# Patient Record
Sex: Male | Born: 2000 | Race: White | Hispanic: No | Marital: Single | Smoking: Never smoker
Health system: Southern US, Community
[De-identification: ages and names within clinical notes are randomized; demographics above are authoritative.]

## PROBLEM LIST (undated history)

## (undated) DIAGNOSIS — K219 Gastro-esophageal reflux disease without esophagitis: Secondary | ICD-10-CM

## (undated) DIAGNOSIS — T7840XA Allergy, unspecified, initial encounter: Secondary | ICD-10-CM

## (undated) HISTORY — DX: Allergy, unspecified, initial encounter: T78.40XA

## (undated) HISTORY — DX: Gastro-esophageal reflux disease without esophagitis: K21.9

---
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
## 2018-03-03 ENCOUNTER — Ambulatory Visit: Payer: BLUE CROSS/BLUE SHIELD | Admitting: Family Medicine

## 2018-03-04 ENCOUNTER — Other Ambulatory Visit: Payer: Self-pay

## 2018-03-04 ENCOUNTER — Ambulatory Visit: Payer: BLUE CROSS/BLUE SHIELD | Admitting: Family Medicine

## 2018-03-04 ENCOUNTER — Encounter: Payer: Self-pay | Admitting: Family Medicine

## 2018-03-04 VITALS — BP 112/72 | HR 102 | Temp 97.7°F | Ht 70.0 in | Wt 132.7 lb

## 2018-03-04 DIAGNOSIS — B078 Other viral warts: Secondary | ICD-10-CM

## 2018-03-04 NOTE — Patient Instructions (Signed)
Warts    Warts are small growths on the skin. They are common and can occur on many areas of the body. A person may have one wart or several warts.  In many cases, warts do not require treatment. They usually go away on their own over a period of many months to a few years. If needed, warts that cause problems or do not go away on their own can be treated.  What are the causes?  Warts are caused by a type of virus that is called human papillomavirus (HPV).  · This virus can spread from person to person through direct contact.  · Warts can also spread to other areas of the body when a person scratches a wart and then scratches another area of his or her body.  What increases the risk?  You are more likely to develop this condition if:  · You are 10-20 years old.  · You have a weakened body defense system (immune system).  · You are Caucasian.  What are the signs or symptoms?  The main symptom of this condition is small growths on the skin. Warts may:  · Be round or oval or have an irregular shape.  · Have a rough surface.  · Range in color from skin color to light yellow, brown, or gray.  · Generally be less than ½ inch (1.3 cm) in size.  · Go away and then come back again.  Most warts are painless, but some can be painful if they are large or occur in an area of the body where pressure will be applied to them, such as the bottom of the foot.  How is this diagnosed?  A wart can usually be diagnosed based on its appearance. In some cases, a tissue sample may be removed (biopsy) to be looked at under a microscope.  How is this treated?  In many cases, warts do not need treatment. Sometimes treatment is desired. If treatment is needed or desired, options may include:  · Applying medicated solutions, creams, or patches to the wart. These may be over-the-counter or prescription medicines that make the skin soft so that layers will gradually shed away. In many cases, the medicine is applied one or two times per day and  covered with a bandage.  · Putting duct tape over the top of the wart (occlusion). You will leave the tape in place for as long as told by your health care provider and then replace it with a new strip of tape. This is done until the wart goes away.  · Freezing the wart with liquid nitrogen (cryotherapy).  · Burning the wart with:  ? Laser treatment.  ? An electrified probe (electrocautery).  · Injection of a medicine (Candida antigen) into the wart to help the body's immune system fight off the wart.  · Surgery to remove the wart.  Follow these instructions at home:  Medicines  · Apply over-the-counter and prescription medicines only as told by your health care provider.  · Do not apply over-the-counter wart medicines to your face or genitals unless your health care provider tells you to do that.  Lifestyle  · Keep your immune system healthy. To do this:  ? Eat a healthy, balanced diet.  ? Get enough sleep.  ? Do not use any products that contain nicotine or tobacco, such as cigarettes and e-cigarettes. If you need help quitting, ask your health care provider.  General instructions    · Wash your hands   after you touch a wart.  · Do not scratch or pick at a wart.  · Avoid shaving hair that is over a wart.  · Keep all follow-up visits as told by your health care provider. This is important.  Contact a health care provider if:  · Your warts do not improve after treatment.  · You have redness, swelling, or pain at the site of a wart.  · You have bleeding from a wart that does not stop with light pressure.  · You have diabetes and you develop a wart.  Summary  · Warts are small growths on the skin. They are common and can occur on many areas of the body.  · In many cases, warts do not need treatment. Sometimes treatment is desired. If treatment is needed or desired, there are several treatment options.  · Apply over-the-counter and prescription medicines only as told by your health care provider.  · Wash your hands  after you touch a wart.  · Keep all follow-up visits as told by your health care provider. This is important.  This information is not intended to replace advice given to you by your health care provider. Make sure you discuss any questions you have with your health care provider.  Document Released: 11/21/2004 Document Revised: 07/01/2017 Document Reviewed: 07/01/2017  Elsevier Interactive Patient Education © 2019 Elsevier Inc.

## 2018-03-04 NOTE — Progress Notes (Signed)
  Subjective:     Patient ID: Arthur Perez, male   DOB: 26-Aug-2000, 18 y.o.   MRN: [id number]  HPI Patient is seen with wart left thumb.  This has been treated with liquid nitrogen in the past.  He did see reduction in size but never fully went away.  He would like to consider treatment again.  This is partly irritating because of location He has not tried any over-the-counter topicals  Review of Systems  HENT: Negative for congestion and sore throat.   Hematological: Negative for adenopathy.       Objective:   Physical Exam HENT:     Mouth/Throat:     Pharynx: Oropharynx is clear.  Neck:     Musculoskeletal: Neck supple.  Lymphadenopathy:     Cervical: No cervical adenopathy.  Skin:    Comments: Has fairly large approximately half centimeter wart on the medial aspect left thumb        Assessment:     Common wart left thumb    Plan:     -Discussed risk and benefits of treat with liquid nitrogen.  Patient consented.  We treated this with cryotherapy and patient tolerated well -We recommended follow-up in 3 weeks to reassess.  Consider retreatment then if this is not fully resolved  Eulas Post MD [facility] at [facility]

## 2018-10-23 ENCOUNTER — Encounter: Payer: Self-pay | Admitting: Family Medicine

## 2018-10-23 ENCOUNTER — Other Ambulatory Visit: Payer: Self-pay

## 2018-10-23 ENCOUNTER — Ambulatory Visit (INDEPENDENT_AMBULATORY_CARE_PROVIDER_SITE_OTHER): Payer: BC Managed Care – PPO | Admitting: Family Medicine

## 2018-10-23 VITALS — BP 108/70 | HR 95 | Temp 97.9°F | Ht 70.0 in | Wt 129.7 lb

## 2018-10-23 DIAGNOSIS — K219 Gastro-esophageal reflux disease without esophagitis: Secondary | ICD-10-CM

## 2018-10-23 DIAGNOSIS — Z23 Encounter for immunization: Secondary | ICD-10-CM | POA: Diagnosis not present

## 2018-10-23 DIAGNOSIS — B078 Other viral warts: Secondary | ICD-10-CM | POA: Diagnosis not present

## 2018-10-23 NOTE — Progress Notes (Signed)
  Subjective:     Patient ID: Arthur Perez, male   DOB: Nov 22, 2000, 18 y.o.   MRN: [id number]  HPI Patient is seen for the following issues  He has had ongoing issues with GERD intermittently throughout his childhood.  Has had some recent issues with GERD especially at night.  Worse after fatty foods such as pizza.  Spicy foods seem to aggravate as well.  He has cut back on caffeine.  No regular alcohol.  He uses Tums which usually helps.  No dysphagia.  Appetite and weight stable.  Patient has recurrent wart left thumb.  This been treated with liquid nitrogen past and responded temporarily.  He has not tried any over-the-counter topicals such as salicylic acid.  This has responded in the past with liquid nitrogen but tends to recur each time.  He is requesting flu vaccine.  He attends [employer] and is doing combination of classes both virtually and in person.  Past Medical History:  Diagnosis Date  . Allergy   . GERD (gastroesophageal reflux disease)    ?  . Jaundice, physiologic, newborn    History reviewed. No pertinent surgical history.  reports that he has never smoked. He has never used smokeless tobacco. He reports that he does not drink alcohol or use drugs. family history includes Other in his sister. No Known Allergies   Review of Systems  Constitutional: Negative for appetite change, chills, fever and unexpected weight change.  HENT: Negative for trouble swallowing.   Respiratory: Negative for cough and shortness of breath.   Cardiovascular: Negative for chest pain.  Gastrointestinal: Negative for abdominal pain.  Hematological: Negative for adenopathy.       Objective:   Physical Exam Constitutional:      Appearance: Normal appearance.  Cardiovascular:     Rate and Rhythm: Normal rate and regular rhythm.  Pulmonary:     Effort: Pulmonary effort is normal.     Breath sounds: Normal breath sounds.  Skin:    Comments: Patient has common wart left thumb at  the interphalangeal joint.  Approximately 8 to 9 mm diameter  Neurological:     Mental Status: He is alert.        Assessment:     #1 frequent GERD symptoms.  #2 Common wart involving left thumb    Plan:     -Discussed dietary factors related to GERD.  Avoid late night eating.  Reduce fatty and spicy foods.  Consider over-the-counter Pepcid 20 mg twice daily as needed  -We discussed various treatment options for his wart.  He has had good response with liquid nitrogen but unfortunately recurrence.  We discussed topical treatments such as TCA.  After discussing risk and benefits of liquid nitrogen and topical therapy including pain, blistering, infection he wished to proceed with trial of combination therapy with liquid nitrogen followed by 80% TCA.  We will plan follow-up in 1 week to reassess and consider repeat application then  Eulas Post MD [facility] at [facility]

## 2018-10-23 NOTE — Patient Instructions (Signed)
Food Choices for Gastroesophageal Reflux Disease, Adult When you have gastroesophageal reflux disease (GERD), the foods you eat and your eating habits are very important. Choosing the right foods can help ease the discomfort of GERD. Consider working with a diet and nutrition specialist (dietitian) to help you make healthy food choices. What general guidelines should I follow?  Eating plan  Choose healthy foods low in fat, such as fruits, vegetables, whole grains, low-fat dairy products, and lean meat, fish, and poultry.  Eat frequent, small meals instead of three large meals each day. Eat your meals slowly, in a relaxed setting. Avoid bending over or lying down until 2-3 hours after eating.  Limit high-fat foods such as fatty meats or fried foods.  Limit your intake of oils, butter, and shortening to less than 8 teaspoons each day.  Avoid the following: ? Foods that cause symptoms. These may be different for different people. Keep a food diary to keep track of foods that cause symptoms. ? Alcohol. ? Drinking large amounts of liquid with meals. ? Eating meals during the 2-3 hours before bed.  Cook foods using methods other than frying. This may include baking, grilling, or broiling. Lifestyle  Maintain a healthy weight. Ask your health care provider what weight is healthy for you. If you need to lose weight, work with your health care provider to do so safely.  Exercise for at least 30 minutes on 5 or more days each week, or as told by your health care provider.  Avoid wearing clothes that fit tightly around your waist and chest.  Do not use any products that contain nicotine or tobacco, such as cigarettes and e-cigarettes. If you need help quitting, ask your health care provider.  Sleep with the head of your bed raised. Use a wedge under the mattress or blocks under the bed frame to raise the head of the bed. What foods are not recommended? The items listed may not be a complete  list. Talk with your dietitian about what dietary choices are best for you. Grains Pastries or quick breads with added fat. French toast. Vegetables Deep fried vegetables. French fries. Any vegetables prepared with added fat. Any vegetables that cause symptoms. For some people this may include tomatoes and tomato products, chili peppers, onions and garlic, and horseradish. Fruits Any fruits prepared with added fat. Any fruits that cause symptoms. For some people this may include citrus fruits, such as oranges, grapefruit, pineapple, and lemons. Meats and other protein foods High-fat meats, such as fatty beef or pork, hot dogs, ribs, ham, sausage, salami and bacon. Fried meat or protein, including fried fish and fried chicken. Nuts and nut butters. Dairy Whole milk and chocolate milk. Sour cream. Cream. Ice cream. Cream cheese. Milk shakes. Beverages Coffee and tea, with or without caffeine. Carbonated beverages. Sodas. Energy drinks. Fruit juice made with acidic fruits (such as orange or grapefruit). Tomato juice. Alcoholic drinks. Fats and oils Butter. Margarine. Shortening. Ghee. Sweets and desserts Chocolate and cocoa. Donuts. Seasoning and other foods Pepper. Peppermint and spearmint. Any condiments, herbs, or seasonings that cause symptoms. For some people, this may include curry, hot sauce, or vinegar-based salad dressings. Summary  When you have gastroesophageal reflux disease (GERD), food and lifestyle choices are very important to help ease the discomfort of GERD.  Eat frequent, small meals instead of three large meals each day. Eat your meals slowly, in a relaxed setting. Avoid bending over or lying down until 2-3 hours after eating.  Limit high-fat   foods such as fatty meat or fried foods. This information is not intended to replace advice given to you by your health care provider. Make sure you discuss any questions you have with your health care provider. Document Released:  02/11/2005 Document Revised: 06/04/2018 Document Reviewed: 02/13/2016 Elsevier Patient Education  2020 Elsevier Inc.  

## 2018-10-23 NOTE — Addendum Note (Signed)
Addended by: Sheffield Slider L on: 10/23/2018 11:10 AM   Modules accepted: Orders

## 2018-10-25 DIAGNOSIS — Z1159 Encounter for screening for other viral diseases: Secondary | ICD-10-CM | POA: Diagnosis not present

## 2018-10-25 DIAGNOSIS — R0981 Nasal congestion: Secondary | ICD-10-CM | POA: Diagnosis not present

## 2018-10-26 ENCOUNTER — Telehealth: Payer: Self-pay | Admitting: Family Medicine

## 2018-10-26 NOTE — Telephone Encounter (Signed)
Copied from [place] [phone]. Topic: General - Inquiry >> Oct 26, 2018  4:26 PM Alanda Slim E wrote: Reason for CRM: Pt has lost his sense of taste and went to get tested and wants to advised Dr. Elease Hashimoto. Pt would like to know what Dr. Elease Hashimoto recommends if he does test positive for covid/ please advise

## 2018-10-28 ENCOUNTER — Telehealth: Payer: Self-pay | Admitting: *Deleted

## 2018-10-28 NOTE — Telephone Encounter (Signed)
If negative, probably secondary to congestion/allergies.  We are going to be seeing him back soon for wart follow up and will discuss then.

## 2018-10-28 NOTE — Telephone Encounter (Signed)
Copied from [place] [phone]. Topic: General - Inquiry >> Oct 26, 2018  4:26 PM Alanda Slim E wrote: Reason for CRM: Pt has lost his sense of taste and went to get tested and wants to advised Dr. Elease Hashimoto. Pt would like to know what Dr. Elease Hashimoto recommends if he does test positive for covid/ please advise >> Oct 28, 2018  6:00 PM Mcneil, Ja-Kwan wrote: Pt mother called in to report that the urgent care contacted pt to advise that pt tested positive for Covid-19. Pt mother stated pt was just in the office for an appt and requests a call back. Pt mother stated she needs to know what Dr. Elease Hashimoto recommendations are.

## 2018-10-28 NOTE — Telephone Encounter (Signed)
Mom is calling and stated pt's covid test came back negative. Please call to advise

## 2018-10-28 NOTE — Telephone Encounter (Signed)
Please see message.  Please advise. 

## 2018-10-28 NOTE — Telephone Encounter (Signed)
I called patients mom and she stated that the urgent care called back and said his Covid test is positive. Now mother is having symptoms and having some chest pressure and a lower heart rate. They both have left ear pain. FYI, setting up Telephone visit for her.

## 2018-10-29 NOTE — Telephone Encounter (Signed)
I spoke with pt and mom last night.  He indicated he had loss of smell and taste and some mild nasal congestion but o/w no symptoms.  No fever, cough, or dyspnea.  We were around him on 10-23-18 but pt and I had on mask full time and most of the 9-10 minute time in room were > 10 feet apart.  Only very briefly (when freezing his wart) est 2 minutes was in closer proximity.  Mom has no symptoms but she plans to contact her physician.

## 2018-11-05 NOTE — Telephone Encounter (Signed)
Recommend stay self quarantined for 14 days - can retest at that time

## 2018-11-05 NOTE — Telephone Encounter (Signed)
Please advise 

## 2018-11-05 NOTE — Telephone Encounter (Signed)
Patient and Mom is aware.

## 2018-11-30 ENCOUNTER — Telehealth (INDEPENDENT_AMBULATORY_CARE_PROVIDER_SITE_OTHER): Payer: BC Managed Care – PPO | Admitting: Family Medicine

## 2018-11-30 ENCOUNTER — Other Ambulatory Visit: Payer: Self-pay

## 2018-11-30 ENCOUNTER — Telehealth: Payer: Self-pay

## 2018-11-30 DIAGNOSIS — U071 COVID-19: Secondary | ICD-10-CM

## 2018-11-30 NOTE — Progress Notes (Signed)
This visit type was conducted due to national recommendations for restrictions regarding the COVID-19 pandemic in an effort to limit this patient's exposure and mitigate transmission in our community.   Virtual Visit via Video Note  I connected with Arthur Perez on 11/30/18 at  3:45 PM EDT by a video enabled telemedicine application and verified that I am speaking with the correct person using two identifiers.  Location patient: home Location provider:work or home office Persons participating in the virtual visit: patient, provider  I discussed the limitations of evaluation and management by telemedicine and the availability of in person appointments. The patient expressed understanding and agreed to proceed.   HPI: Appointment was set up apparently by his mother.  He did have confirmed COVID infection back in the end of August.  He was minimally symptomatic.  He had loss of taste and smell and some nasal congestion but really no other symptoms.  He was not aware of any cough or shortness of breath or significant fever.  His symptoms resolved after a few days.  His mother apparently called with concerns he was having increased fatigue and seemed quieter than usual.  Silus denies this.  He states he is working about 15 to 20 hours/week and also in school about 20 to 30 hours/week.  He feels he is getting adequate sleep.  He states he is not more fatigued than usual.  He denies any depression symptoms.  He feels fully recovered at this time.   ROS: See pertinent positives and negatives per HPI.  Past Medical History:  Diagnosis Date  . Allergy   . GERD (gastroesophageal reflux disease)    ?  . Jaundice, physiologic, newborn     No past surgical history on file.  Family History  Problem Relation Age of Onset  . Other Sister        CAPD    SOCIAL HX: Non-smoker   Current Outpatient Medications:  .  calcium carbonate (TUMS - DOSED IN MG ELEMENTAL CALCIUM) 500 MG chewable tablet,  Chew 1 tablet by mouth daily., Disp: , Rfl:   EXAM:  VITALS per patient if applicable:  GENERAL: alert, oriented, appears well and in no acute distress  HEENT: atraumatic, conjunttiva clear, no obvious abnormalities on inspection of external nose and ears  NECK: normal movements of the head and neck  LUNGS: on inspection no signs of respiratory distress, breathing rate appears normal, no obvious gross SOB, gasping or wheezing  CV: no obvious cyanosis  MS: moves all visible extremities without noticeable abnormality  PSYCH/NEURO: pleasant and cooperative, no obvious depression or anxiety, speech and thought processing grossly intact  ASSESSMENT AND PLAN:  Discussed the following assessment and plan:  COVID-19 virus infection-recovered symptomatically at this point.  Mother had some concerns about fatigue but patient adamantly denies this. -Recommend observation for now.  No further testing needed at this time     I discussed the assessment and treatment plan with the patient. The patient was provided an opportunity to ask questions and all were answered. The patient agreed with the plan and demonstrated an understanding of the instructions.   The patient was advised to call back or seek an in-person evaluation if the symptoms worsen or if the condition fails to improve as anticipated.    Carolann Littler, MD

## 2018-11-30 NOTE — Telephone Encounter (Signed)
Copied from [place] [id number]. Topic: General - Other >> Nov 30, 2018 12:22 PM Carolyn Stare wrote: Mom call to say pt still has some what of a cough and she was concerned about it and wanted to let Dr Elease Hashimoto know

## 2018-12-02 NOTE — Telephone Encounter (Signed)
I already spoke to [place] (yesterday) and he stated that he felt fine- no fever, dyspnea, fatigue, or significant cough.

## 2018-12-04 NOTE — Telephone Encounter (Signed)
Please advise 

## 2018-12-04 NOTE — Telephone Encounter (Signed)
If patient has concerns, can set up Doxy.  As noted, I spoke with pt earlier in week and HE had no concerns.

## 2018-12-04 NOTE — Telephone Encounter (Signed)
Pts mom was called and told pt was not concerned with cough but if he was then he could call and set up appt to be seen or do a VV. Mom stated patient needs appt for wart removal. This was scheduled.

## 2018-12-09 ENCOUNTER — Other Ambulatory Visit: Payer: Self-pay

## 2018-12-09 ENCOUNTER — Encounter: Payer: Self-pay | Admitting: Family Medicine

## 2018-12-09 ENCOUNTER — Ambulatory Visit (INDEPENDENT_AMBULATORY_CARE_PROVIDER_SITE_OTHER): Payer: BC Managed Care – PPO | Admitting: Family Medicine

## 2018-12-09 VITALS — BP 92/58 | HR 76 | Temp 98.5°F | Resp 16 | Ht 70.0 in | Wt 132.6 lb

## 2018-12-09 DIAGNOSIS — B078 Other viral warts: Secondary | ICD-10-CM | POA: Diagnosis not present

## 2018-12-09 NOTE — Progress Notes (Signed)
  Subjective:     Patient ID: Arthur Perez, male   DOB: 2001/01/22, 18 y.o.   MRN: [id number]  HPI Here for retreatment of wart left thumb.  This has been present intermittently for several months.  We treated with liquid nitrogen several weeks ago and this has diminished some in size.  He has not tried any recent salicylic acid.  Patient did have recent Covid and is fully recovered at this time.  His symptoms were very mild.  Past Medical History:  Diagnosis Date  . Allergy   . GERD (gastroesophageal reflux disease)    ?  . Jaundice, physiologic, newborn    No past surgical history on file.  reports that he has never smoked. He has never used smokeless tobacco. He reports that he does not drink alcohol or use drugs. family history includes Other in his sister. No Known Allergies   Review of Systems  Constitutional: Negative for chills and fever.  Respiratory: Negative for cough.        Objective:   Physical Exam Constitutional:      Appearance: Normal appearance.  Skin:    Comments: Patient has common wart left thumb between the interphalangeal and MCP joint.  Neurological:     Mental Status: He is alert.        Assessment:     Common wart left thumb    Plan:     -Discussed risk of liquid nitrogen including pain, blistering, infection and patient consented.  This was treated without difficulty  -We have suggested that he try over-the-counter salicylic acid starting in about a week and if this is not fully resolved in a few weeks to be in touch  Eulas Post MD [facility]

## 2018-12-09 NOTE — Patient Instructions (Signed)
Consider treatment with OTC salicylic acid treatment -[id number] or 50 %

## 2018-12-21 ENCOUNTER — Telehealth: Payer: Self-pay | Admitting: *Deleted

## 2018-12-21 NOTE — Telephone Encounter (Signed)
Copied from [place] [id number]. Topic: General - Other >> Dec 21, 2018  8:27 AM Keene Breath wrote: Reason for CRM: Patient's mother called to request a referral for a COVID test.  Please advise and call to discuss at [phone]

## 2018-12-21 NOTE — Telephone Encounter (Signed)
I called the pts mother and left a detailed message stating a referral is not needed as she can take the pt to [facility] or [facility] for testing.

## 2019-03-19 ENCOUNTER — Telehealth: Payer: BC Managed Care – PPO | Admitting: Family Medicine

## 2019-03-24 ENCOUNTER — Telehealth: Payer: Self-pay

## 2019-03-24 ENCOUNTER — Telehealth: Payer: BC Managed Care – PPO | Admitting: Family Medicine

## 2019-03-24 ENCOUNTER — Other Ambulatory Visit: Payer: Self-pay

## 2019-03-24 NOTE — Telephone Encounter (Signed)
Patient did not answer for his telephone call today at 8:30am. Per Dr. Elease Hashimoto patient missed last appointment on 03/19/2019 and it looks like he called to cancel this appointment and reschedule.  Sending as Arthur Perez to keep track of this as there have been several cancellations in the past and this needs to be monitored if patient does not respond to a virtual and/or telephone visit.

## 2020-03-02 DIAGNOSIS — Z03818 Encounter for observation for suspected exposure to other biological agents ruled out: Secondary | ICD-10-CM | POA: Diagnosis not present

## 2020-12-27 ENCOUNTER — Encounter: Payer: Self-pay | Admitting: Family Medicine

## 2020-12-27 ENCOUNTER — Ambulatory Visit (INDEPENDENT_AMBULATORY_CARE_PROVIDER_SITE_OTHER): Payer: BC Managed Care – PPO | Admitting: Family Medicine

## 2020-12-27 ENCOUNTER — Other Ambulatory Visit: Payer: Self-pay

## 2020-12-27 VITALS — BP 120/60 | HR 85 | Temp 98.5°F | Wt 162.8 lb

## 2020-12-27 DIAGNOSIS — Z0000 Encounter for general adult medical examination without abnormal findings: Secondary | ICD-10-CM

## 2020-12-27 DIAGNOSIS — Z23 Encounter for immunization: Secondary | ICD-10-CM | POA: Diagnosis not present

## 2020-12-27 NOTE — Patient Instructions (Signed)
Try to scale back on fast food intake.  Consider tetanus booster at some point in the next year.

## 2020-12-27 NOTE — Progress Notes (Signed)
Established Patient Office Visit  Subjective:  Patient ID: Arthur Perez, male    DOB: 2000/11/20  Age: 20 y.o. MRN: [id number]  CC:  Chief Complaint  Patient presents with   Annual Exam    HPI Arthur Perez presents for physical exam.  He is currently working about 60 hours/week.  He has 1 year to go with finishing college and may resume in the spring or possibly next year.  He is not sure yet.  Generally doing well.  He has gained some weight during the past year.  He attributes this to eating fast food frequently.  Staying very active with his job but no formal exercise.  Non-smoker.  No regular alcohol use.  -He does need flu vaccine -Also due for tetanus booster this year but declines today. -Other vaccines up-to-date  Family history-significant for father having type 2 diabetes.  No family history of premature CAD.  Social history-he is single.  Non-smoker.  No regular alcohol use.  Does have a steady girlfriend.  Past Medical History:  Diagnosis Date   Allergy    GERD (gastroesophageal reflux disease)    ?   Jaundice, physiologic, newborn     No past surgical history on file.  Family History  Problem Relation Age of Onset   Other Sister        CAPD    Social History   Socioeconomic History   Marital status: Single    Spouse name: Not on file   Number of children: Not on file   Years of education: Not on file   Highest education level: Not on file  Occupational History   Not on file  Tobacco Use   Smoking status: Never   Smokeless tobacco: Never  Vaping Use   Vaping Use: Former  Substance and Sexual Activity   Alcohol use: No   Drug use: No   Sexual activity: Not on file  Other Topics Concern   Not on file  Social History Narrative   Parents divorced   Lives with Arthur Perez during the school year   Mom Arthur Perez moved from [place] back to [place]   Social Determinants of Health   Financial Resource Strain: Not on file  Food Insecurity: Not on file   Transportation Needs: Not on file  Physical Activity: Not on file  Stress: Not on file  Social Connections: Not on file  Intimate Partner Violence: Not on file    Outpatient Medications Prior to Visit  Medication Sig Dispense Refill   calcium carbonate (TUMS - DOSED IN MG ELEMENTAL CALCIUM) 500 MG chewable tablet Chew 1 tablet by mouth daily.     raNITIdine HCl (ZANTAC PO) Take by mouth.     No facility-administered medications prior to visit.    No Known Allergies  ROS Review of Systems  Constitutional:  Negative for activity change, appetite change, fatigue and fever.  HENT:  Negative for congestion, ear pain and trouble swallowing.   Eyes:  Negative for pain and visual disturbance.  Respiratory:  Negative for cough, shortness of breath and wheezing.   Cardiovascular:  Negative for chest pain and palpitations.  Gastrointestinal:  Negative for abdominal distention, abdominal pain, blood in stool, constipation, diarrhea, nausea, rectal pain and vomiting.  Endocrine: Negative for polydipsia and polyuria.  Genitourinary:  Negative for dysuria, hematuria and testicular pain.  Musculoskeletal:  Negative for arthralgias and joint swelling.  Skin:  Negative for rash.  Neurological:  Negative for dizziness, syncope and headaches.  Hematological:  Negative for adenopathy.  Psychiatric/Behavioral:  Negative for confusion and dysphoric mood.      Objective:    Physical Exam Constitutional:      General: He is not in acute distress.    Appearance: He is well-developed.  HENT:     Head: Normocephalic and atraumatic.     Right Ear: External ear normal.     Left Ear: External ear normal.  Eyes:     Conjunctiva/sclera: Conjunctivae normal.     Pupils: Pupils are equal, round, and reactive to light.  Neck:     Thyroid: No thyromegaly.  Cardiovascular:     Rate and Rhythm: Normal rate and regular rhythm.     Heart sounds: Normal heart sounds. No murmur heard. Pulmonary:      Effort: No respiratory distress.     Breath sounds: No wheezing or rales.  Abdominal:     General: Bowel sounds are normal. There is no distension.     Palpations: Abdomen is soft. There is no mass.     Tenderness: There is no abdominal tenderness. There is no guarding or rebound.  Musculoskeletal:     Cervical back: Normal range of motion and neck supple.     Right lower leg: No edema.     Left lower leg: No edema.  Lymphadenopathy:     Cervical: No cervical adenopathy.  Skin:    Findings: No rash.  Neurological:     Mental Status: He is alert and oriented to person, place, and time.     Cranial Nerves: No cranial nerve deficit.    BP 120/60 (BP Location: Left Arm, Patient Position: Sitting, Cuff Size: Normal)   Pulse 85   Temp 98.5 F (36.9 C) (Oral)   Wt 162 lb 12.8 oz (73.8 kg)   SpO2 98%   BMI 23.36 kg/m  Wt Readings from Last 3 Encounters:  12/27/20 162 lb 12.8 oz (73.8 kg)  12/09/18 132 lb 9.6 oz (60.1 kg) (15 %, Z= -1.05)*  10/23/18 129 lb 11.2 oz (58.8 kg) (11 %, Z= -1.20)*   * Growth percentiles are based on CDC (Boys, 2-20 Years) data.     Health Maintenance Due  Topic Date Due   COVID-19 Vaccine (1) Never done   HIV Screening  Never done   Hepatitis C Screening  Never done    There are no preventive care reminders to display for this patient.  Lab Results  Component Value Date   TSH 1.41 09/02/2014   Lab Results  Component Value Date   WBC 23.4 (H) 08/03/2016   HGB 15.4 08/03/2016   HCT 43.7 08/03/2016   MCV 94.0 08/03/2016   PLT 241 08/03/2016   Lab Results  Component Value Date   NA 140 08/03/2016   K 3.6 08/03/2016   CO2 26 08/03/2016   GLUCOSE 108 (H) 08/03/2016   BUN 19 08/03/2016   CREATININE 0.79 08/03/2016   BILITOT 1.8 (H) 08/03/2016   ALKPHOS 69 08/03/2016   AST 33 08/03/2016   ALT 34 08/03/2016   PROT 7.8 08/03/2016   ALBUMIN 4.6 08/03/2016   CALCIUM 9.1 08/03/2016   ANIONGAP 9 08/03/2016   GFR 155.68 09/02/2014   No  results found for: CHOL No results found for: HDL No results found for: LDLCALC No results found for: TRIG No results found for: CHOLHDL No results found for: HGBA1C    Assessment & Plan:   Physical exam.  Generally healthy 20 year old male.  No active medical problems.  We discussed the following health maintenance issues  -Flu vaccine given -Offered screening labs and he declines -Recommend tetanus booster with Tdap but he declines at this time.  He will consider at some point this year. -Discussed STD prevention and encourage barrier protection -Discussed healthy food choices.  Try to scale back gradually on fast food intake   No orders of the defined types were placed in this encounter.   Follow-up: No follow-ups on file.    Carolann Littler, MD

## 2021-05-16 ENCOUNTER — Ambulatory Visit: Payer: BC Managed Care – PPO | Admitting: Family Medicine

## 2021-05-16 ENCOUNTER — Encounter: Payer: Self-pay | Admitting: Family Medicine

## 2021-05-16 VITALS — BP 124/66 | HR 122 | Temp 97.7°F | Ht 70.0 in | Wt 167.9 lb

## 2021-05-16 DIAGNOSIS — F66 Other sexual disorders: Secondary | ICD-10-CM

## 2021-05-16 NOTE — Progress Notes (Signed)
? ?Established Patient Office Visit ? ?Subjective:  ?Patient ID: Arthur Perez, male    DOB: 2000-10-21  Age: 21 y.o. MRN: [id number] ? ?CC:  ?Chief Complaint  ?Patient presents with  ? Mental Health Problem  ? Cough  ?  Patient complains of cough, x2 weeks, Productive cough with yellow sputum,   ? Nasal Congestion  ?  Patient complains of nasal congestion, x2 weeks  ? ? ?HPI ?Arthur Perez presents for some mild cough and nasal congestion past couple weeks.  He states this is of minimal concern he feels like he is getting better.  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  He states this has been progressive specially past couple years.  He had first exposure around age 10.  He feels like this is negatively impacted relationship with his girlfriend recently.  He very much would like to get help.  He does not have history of any other addictive behaviors.  No nicotine use.  No regular alcohol use.  No drug addiction history.  He feels like his current addiction is robbing him of other things in life.  He is willing to get counseling help. ? ?Past Medical History:  ?Diagnosis Date  ? Allergy   ? GERD (gastroesophageal reflux disease)   ? ?  ? Jaundice, physiologic, newborn   ? ? ?History reviewed. No pertinent surgical history. ? ?Family History  ?Problem Relation Age of Onset  ? Other Sister   ?     CAPD  ? ? ?Social History  ? ?Socioeconomic History  ? Marital status: Single  ?  Spouse name: Not on file  ? Number of children: Not on file  ? Years of education: Not on file  ? Highest education level: Not on file  ?Occupational History  ? Not on file  ?Tobacco Use  ? Smoking status: Never  ? Smokeless tobacco: Never  ?Vaping Use  ? Vaping Use: Former  ?Substance and Sexual Activity  ? Alcohol use: No  ? Drug use: No  ? Sexual activity: Not on file  ?Other Topics Concern  ? Not on file  ?Social History Narrative  ? Parents divorced  ? Lives with Kiran Carline during the school year  ? Mom  Dolores Hoose moved from [place] back to [place]  ? ?Social Determinants of Health  ? ?Financial Resource Strain: Not on file  ?Food Insecurity: Not on file  ?Transportation Needs: Not on file  ?Physical Activity: Not on file  ?Stress: Not on file  ?Social Connections: Not on file  ?Intimate Partner Violence: Not on file  ? ? ?Outpatient Medications Prior to Visit  ?Medication Sig Dispense Refill  ? calcium carbonate (TUMS - DOSED IN MG ELEMENTAL CALCIUM) 500 MG chewable tablet Chew 1 tablet by mouth daily.    ? raNITIdine HCl (ZANTAC PO) Take by mouth.    ? ?No facility-administered medications prior to visit.  ? ? ?No Known Allergies ? ?ROS ?Review of Systems  ?Constitutional:  Negative for chills and fever.  ?HENT:  Positive for congestion.   ?Respiratory:  Positive for cough.   ?Cardiovascular:  Negative for chest pain.  ?Psychiatric/Behavioral:  Negative for dysphoric mood.   ? ?  ?Objective:  ?  ?Physical Exam ?Vitals reviewed.  ?Constitutional:   ?   Appearance: Normal appearance.  ?Cardiovascular:  ?   Rate and Rhythm: Normal rate and regular rhythm.  ?Pulmonary:  ?   Effort: Pulmonary effort is normal.  ?  Breath sounds: Normal breath sounds.  ?Neurological:  ?   General: No focal deficit present.  ?   Mental Status: He is alert.  ?Psychiatric:     ?   Mood and Affect: Mood normal.     ?   Thought Content: Thought content normal.  ? ? ?BP 124/66 (BP Location: Left Arm, Patient Position: Sitting, Cuff Size: Normal)   Pulse (!) 122   Temp 97.7 ?F (36.5 ?C) (Oral)   Ht '5\' 10"'$  (1.778 m)   Wt 167 lb 14.4 oz (76.2 kg)   SpO2 98%   BMI 24.09 kg/m?  ?Wt Readings from Last 3 Encounters:  ?05/16/21 167 lb 14.4 oz (76.2 kg)  ?12/27/20 162 lb 12.8 oz (73.8 kg)  ?12/09/18 132 lb 9.6 oz (60.1 kg) (15 %, Z= -1.05)*  ? ?* Growth percentiles are based on CDC (Boys, 2-20 Years) data.  ? ? ? ?Health Maintenance Due  ?Topic Date Due  ? COVID-19 Vaccine (1) Never done  ? HIV Screening  Never done  ? Hepatitis C Screening  Never done   ? ? ?There are no preventive care reminders to display for this patient. ? ?Lab Results  ?Component Value Date  ? TSH 1.41 09/02/2014  ? ?Lab Results  ?Component Value Date  ? WBC 23.4 (H) 08/03/2016  ? HGB 15.4 08/03/2016  ? HCT 43.7 08/03/2016  ? MCV 94.0 08/03/2016  ? PLT 241 08/03/2016  ? ?Lab Results  ?Component Value Date  ? NA 140 08/03/2016  ? K 3.6 08/03/2016  ? CO2 26 08/03/2016  ? GLUCOSE 108 (H) 08/03/2016  ? BUN 19 08/03/2016  ? CREATININE 0.79 08/03/2016  ? BILITOT 1.8 (H) 08/03/2016  ? ALKPHOS 69 08/03/2016  ? AST 33 08/03/2016  ? ALT 34 08/03/2016  ? PROT 7.8 08/03/2016  ? ALBUMIN 4.6 08/03/2016  ? CALCIUM 9.1 08/03/2016  ? ANIONGAP 9 08/03/2016  ? GFR 155.68 09/02/2014  ? ?No results found for: CHOL ?No results found for: HDL ?No results found for: [place] ?No results found for: TRIG ?No results found for: CHOLHDL ?No results found for: HGBA1C ? ?  ?Assessment & Plan:  ? ?#1 recent probable viral URI.  Nonfocal exam.  Reassurance given. ? ?#2 pornography addiction.  We discussed nature of addictions in general.  I definitely feel like solid counseling could be helpful to him.  We gave him number for our behavioral health division he will also do some research on his own regarding addiction counselors. ? ?No orders of the defined types were placed in this encounter. ? ? ?Follow-up: No follow-ups on file.  ? ? ?Carolann Littler, MD ?

## 2023-04-03 ENCOUNTER — Telehealth: Payer: Self-pay | Admitting: Family Medicine

## 2023-07-22 ENCOUNTER — Encounter: Payer: Self-pay | Admitting: Family Medicine

## 2023-07-22 ENCOUNTER — Ambulatory Visit: Admitting: Family Medicine

## 2023-07-22 ENCOUNTER — Ambulatory Visit: Payer: Self-pay

## 2023-07-22 VITALS — BP 124/80 | HR 102 | Temp 98.0°F | Wt 170.8 lb

## 2023-07-22 DIAGNOSIS — L03012 Cellulitis of left finger: Secondary | ICD-10-CM

## 2023-07-22 MED ORDER — CEPHALEXIN 500 MG PO CAPS: 500.0000 mg | ORAL_CAPSULE | Freq: Four times a day (QID) | ORAL | 0 refills | Status: DC

## 2023-07-22 NOTE — Progress Notes (Signed)
   Established Patient Office Visit  Subjective   Patient ID: Arthur Perez, male    DOB: 09/19/2000  Age: 23 y.o. MRN: [id number]  Chief Complaint  Patient presents with   Hand Pain    HPI   Izic is seen with possible paronychia left thumb.  He had what he described as a "hangnail "about 4 days ago.  Over the weekend this became progressively more swollen and erythematous and he noticed some pus earlier today.  No fever or chills.  Past Medical History:  Diagnosis Date   Allergy    GERD (gastroesophageal reflux disease)    ?   Jaundice, physiologic, newborn    History reviewed. No pertinent surgical history.  reports that he has never smoked. He has never used smokeless tobacco. He reports that he does not drink alcohol and does not use drugs. family history includes Other in his sister. No Known Allergies  Review of Systems  Constitutional:  Negative for chills and fever.      Objective:     BP 124/80 (BP Location: Left Arm, Patient Position: Sitting, Cuff Size: Normal)   Pulse (!) 102   Temp 98 F (36.7 C) (Oral)   Wt 170 lb 12.8 oz (77.5 kg)   SpO2 98%   BMI 24.51 kg/m    Physical Exam Vitals reviewed.  Constitutional:      Appearance: He is not ill-appearing.  Cardiovascular:     Rate and Rhythm: Normal rate and regular rhythm.  Skin:    Comments: Left thumb reveals some swelling and erythema along with some visible purulence down along the medial and inferior portion of the nail.  This appears to be draining some pus spontaneously and with some very gentle pressure we were able to drain a significant amount of pus out of the thumb.  Neurological:     Mental Status: He is alert.      No results found for any visits on 07/22/23.    The ASCVD Risk score (Arnett DK, et al., 2019) failed to calculate for the following reasons:   The 2019 ASCVD risk score is only valid for ages 8 to 25    Assessment & Plan:   Paronychia and cellulitis  involving left thumb.  Spontaneous drainage of pus.  This was draining very well and we did not see any indication for I&D.  -Continue warm salt water soaks several times daily - Start Keflex 500 mg 4 times daily for 7 days - Be in touch if he has any persistent swelling, redness, or other concerns  Glean Lamy, MD

## 2023-07-22 NOTE — Telephone Encounter (Signed)
 Copied from CRM [id number]. Topic: Clinical - Red Word Triage >> Jul 22, 2023 11:25 AM Kevelyn M wrote: Red Word that prompted transfer to Nurse Triage: Finger infection hangnail infection. Patient is experiencing pain and swelling with his left thumb.   Chief Complaint: Pain, swelling left thumb. Redness noted. Symptoms: above Frequency: 2 weeks Pertinent Negatives: Patient denies fever Disposition: [] ED /[] Urgent Care (no appt availability in office) / [x] Appointment(In office/virtual)/ []  [facility]/ [] Home Care/ [] Refused Recommended Disposition /[] [facility]/ []  Follow-up with PCP Additional Notes: agrees with appointment.  Reason for Disposition  Looks infected (spreading redness, pus)  (Exception: Localized redness and swelling of skin around nail.)  Answer Assessment - Initial Assessment Questions 1. ONSET: "When did the pain start?"      2 weeks ago 2. LOCATION and RADIATION: "Where is the pain located?"  (e.g., fingertip, around nail, joint, entire  finger)      Left thumb 3. SEVERITY: "How bad is the pain?" "What does it keep you from doing?"   (Scale 1-10; or mild, moderate, severe)  - MILD (1-3): doesn't interfere with normal activities.   - MODERATE (4-7): interferes with normal activities or awakens from sleep.  - SEVERE (8-10): excruciating pain, unable to hold a glass of water or bend finger even a little.     3-4 4. APPEARANCE: "What does the finger look like?" (e.g., redness, swelling, bruising, pallor)     Swelling, red 5. WORK OR EXERCISE: "Has there been any recent work or exercise that involved this part (i.e., fingers or hand) of the body?"     no 6. CAUSE: "What do you think is causing the pain?"     Clipped a hang nail 7. AGGRAVATING FACTORS: "What makes the pain worse?" (e.g., using computer)     no 8. OTHER SYMPTOMS: "Do you have any other symptoms?" (e.g., fever, neck pain, numbness)     no 9. PREGNANCY: "Is there any chance you  are pregnant?" "When was your last menstrual period?"     N/a  Protocols used: Finger Pain-A-AH

## 2023-07-22 NOTE — Patient Instructions (Signed)
 Continue with warm salt water soaks several times daily.

## 2023-10-29 ENCOUNTER — Ambulatory Visit: Payer: Self-pay | Admitting: Family Medicine

## 2023-10-29 ENCOUNTER — Encounter: Payer: Self-pay | Admitting: Family Medicine

## 2023-10-29 ENCOUNTER — Ambulatory Visit (INDEPENDENT_AMBULATORY_CARE_PROVIDER_SITE_OTHER): Admitting: Family Medicine

## 2023-10-29 VITALS — BP 120/80 | HR 73 | Temp 98.3°F | Ht 69.69 in | Wt 157.1 lb

## 2023-10-29 DIAGNOSIS — Z1159 Encounter for screening for other viral diseases: Secondary | ICD-10-CM | POA: Diagnosis not present

## 2023-10-29 DIAGNOSIS — Z0000 Encounter for general adult medical examination without abnormal findings: Secondary | ICD-10-CM | POA: Diagnosis not present

## 2023-10-29 DIAGNOSIS — Z833 Family history of diabetes mellitus: Secondary | ICD-10-CM | POA: Diagnosis not present

## 2023-10-29 DIAGNOSIS — Z23 Encounter for immunization: Secondary | ICD-10-CM

## 2023-10-29 LAB — CBC WITH DIFFERENTIAL/PLATELET
Basophils Absolute: 0 K/uL (ref 0.0–0.1)
Basophils Relative: 0.6 % (ref 0.0–3.0)
Eosinophils Absolute: 0.1 K/uL (ref 0.0–0.7)
Eosinophils Relative: 2.1 % (ref 0.0–5.0)
HCT: 40.6 % (ref 39.0–52.0)
Hemoglobin: 14 g/dL (ref 13.0–17.0)
Lymphocytes Relative: 28.5 % (ref 12.0–46.0)
Lymphs Abs: 1.8 K/uL (ref 0.7–4.0)
MCHC: 34.5 g/dL (ref 30.0–36.0)
MCV: 92.1 fl (ref 78.0–100.0)
Monocytes Absolute: 0.5 K/uL (ref 0.1–1.0)
Monocytes Relative: 8.6 % (ref 3.0–12.0)
Neutro Abs: 3.7 K/uL (ref 1.4–7.7)
Neutrophils Relative %: 60.2 % (ref 43.0–77.0)
Platelets: 261 K/uL (ref 150.0–400.0)
RBC: 4.41 Mil/uL (ref 4.22–5.81)
RDW: 12.1 % (ref 11.5–15.5)
WBC: 6.2 K/uL (ref 4.0–10.5)

## 2023-10-29 LAB — LIPID PANEL
Cholesterol: 204 mg/dL — ABNORMAL HIGH (ref 0–200)
HDL: 38.1 mg/dL — ABNORMAL LOW (ref >39.00)
LDL Cholesterol: 144 mg/dL — ABNORMAL HIGH (ref 0–99)
NonHDL: 166.2
Total CHOL/HDL Ratio: 5
Triglycerides: 110 mg/dL (ref 0.0–149.0)
VLDL: 22 mg/dL (ref 0.0–40.0)

## 2023-10-29 LAB — HEPATIC FUNCTION PANEL
ALT: 29 U/L (ref 0–53)
AST: 23 U/L (ref 0–37)
Albumin: 4.7 g/dL (ref 3.5–5.2)
Alkaline Phosphatase: 67 U/L (ref 39–117)
Bilirubin, Direct: 0.2 mg/dL (ref 0.0–0.3)
Total Bilirubin: 1.8 mg/dL — ABNORMAL HIGH (ref 0.2–1.2)
Total Protein: 7.9 g/dL (ref 6.0–8.3)

## 2023-10-29 LAB — BASIC METABOLIC PANEL WITH GFR
BUN: 16 mg/dL (ref 6–23)
CO2: 26 meq/L (ref 19–32)
Calcium: 9.3 mg/dL (ref 8.4–10.5)
Chloride: 104 meq/L (ref 96–112)
Creatinine, Ser: 0.78 mg/dL (ref 0.40–1.50)
GFR: 124.65 mL/min (ref >60.00)
Glucose, Bld: 81 mg/dL (ref 70–99)
Potassium: 3.8 meq/L (ref 3.5–5.1)
Sodium: 137 meq/L (ref 135–145)

## 2023-10-29 LAB — HEMOGLOBIN A1C: Hgb A1c MFr Bld: 4.9 % (ref 4.6–6.5)

## 2023-10-29 NOTE — Progress Notes (Signed)
 Established Patient Office Visit  Subjective   Patient ID: Arthur Perez, male    DOB: 2000/04/16  Age: 23 y.o. MRN: [id number]  Chief Complaint  Patient presents with   Annual Exam    HPI   Arthur Perez is seen today for physical exam.  He has history of intermittent GERD symptoms but otherwise reasonably healthy.  Takes no regular medications.  States his father has type 2 diabetes.  Patient also relates some recent irritation around nailbeds and was concerned whether he may have diabetes.  No polyuria, polydipsia, or weight loss.  Finished 2 years of college.  He is currently working doing deliveries.  Health maintenance reviewed.  Last tetanus over 10 years ago.  Declines flu vaccine.  No history of hepatitis C screening but low risk.  Family history significant for father with type 2 diabetes.  He has a sister with hypertension.  Paternal uncle with pancreas cancer who died at 79  Social history-single.  Finished 2 years of college.  Currently works doing deliveries.  Non-smoker.  No regular alcohol.  Past Medical History:  Diagnosis Date   Allergy    GERD (gastroesophageal reflux disease)    ?   Jaundice, physiologic, newborn    History reviewed. No pertinent surgical history.  reports that he has never smoked. He has never used smokeless tobacco. He reports that he does not drink alcohol and does not use drugs. family history includes Other in his sister. No Known Allergies  Review of Systems  Constitutional:  Negative for chills, fever, malaise/fatigue and weight loss.  HENT:  Negative for hearing loss.   Eyes:  Negative for blurred vision and double vision.  Respiratory:  Negative for cough and shortness of breath.   Cardiovascular:  Negative for chest pain, palpitations and leg swelling.  Gastrointestinal:  Negative for abdominal pain, blood in stool, constipation and diarrhea.  Genitourinary:  Negative for dysuria.  Skin:  Negative for rash.  Neurological:   Negative for dizziness, speech change, seizures, loss of consciousness and headaches.  Psychiatric/Behavioral:  Negative for depression.       Objective:     BP 120/80   Pulse 73   Temp 98.3 F (36.8 C) (Oral)   Ht 5' 9.69 (1.77 m)   Wt 157 lb 1.6 oz (71.3 kg)   SpO2 95%   BMI 22.75 kg/m    Physical Exam Vitals reviewed.  Constitutional:      General: He is not in acute distress.    Appearance: He is well-developed. He is not ill-appearing.  HENT:     Head: Normocephalic and atraumatic.     Right Ear: External ear normal.     Left Ear: External ear normal.  Eyes:     Conjunctiva/sclera: Conjunctivae normal.     Pupils: Pupils are equal, round, and reactive to light.  Neck:     Thyroid: No thyromegaly.  Cardiovascular:     Rate and Rhythm: Normal rate and regular rhythm.     Heart sounds: Normal heart sounds. No murmur heard. Pulmonary:     Effort: No respiratory distress.     Breath sounds: No wheezing or rales.  Abdominal:     General: Bowel sounds are normal. There is no distension.     Palpations: Abdomen is soft. There is no mass.     Tenderness: There is no abdominal tenderness. There is no guarding or rebound.  Musculoskeletal:     Cervical back: Normal range of motion and neck supple.  Right lower leg: No edema.     Left lower leg: No edema.  Lymphadenopathy:     Cervical: No cervical adenopathy.  Skin:    Findings: No rash.  Neurological:     Mental Status: He is alert and oriented to person, place, and time.     Cranial Nerves: No cranial nerve deficit.      Results for orders placed or performed in visit on 10/29/23  Hemoglobin A1c  Result Value Ref Range   Hgb A1c MFr Bld 4.9 4.6 - 6.5 %  Hepatic function panel  Result Value Ref Range   Total Bilirubin 1.8 (H) 0.2 - 1.2 mg/dL   Bilirubin, Direct 0.2 0.0 - 0.3 mg/dL   Alkaline Phosphatase 67 39 - 117 U/L   AST 23 0 - 37 U/L   ALT 29 0 - 53 U/L   Total Protein 7.9 6.0 - 8.3 g/dL    Albumin 4.7 3.5 - 5.2 g/dL  CBC with Differential/Platelet  Result Value Ref Range   WBC 6.2 4.0 - 10.5 K/uL   RBC 4.41 4.22 - 5.81 Mil/uL   Hemoglobin 14.0 13.0 - 17.0 g/dL   HCT 59.3 60.9 - 47.9 %   MCV 92.1 78.0 - 100.0 fl   MCHC 34.5 30.0 - 36.0 g/dL   RDW 87.8 88.4 - 84.4 %   Platelets 261.0 150.0 - 400.0 K/uL   Neutrophils Relative % 60.2 43.0 - 77.0 %   Lymphocytes Relative 28.5 12.0 - 46.0 %   Monocytes Relative 8.6 3.0 - 12.0 %   Eosinophils Relative 2.1 0.0 - 5.0 %   Basophils Relative 0.6 0.0 - 3.0 %   Neutro Abs 3.7 1.4 - 7.7 K/uL   Lymphs Abs 1.8 0.7 - 4.0 K/uL   Monocytes Absolute 0.5 0.1 - 1.0 K/uL   Eosinophils Absolute 0.1 0.0 - 0.7 K/uL   Basophils Absolute 0.0 0.0 - 0.1 K/uL  Lipid panel  Result Value Ref Range   Cholesterol 204 (H) 0 - 200 mg/dL   Triglycerides 889.9 0.0 - 149.0 mg/dL   HDL 61.89 (L) >60.99 mg/dL   VLDL 77.9 0.0 - 59.9 mg/dL   LDL Cholesterol 855 (H) 0 - 99 mg/dL   Total CHOL/HDL Ratio 5    NonHDL 166.20   Basic metabolic panel with GFR  Result Value Ref Range   Sodium 137 135 - 145 mEq/L   Potassium 3.8 3.5 - 5.1 mEq/L   Chloride 104 96 - 112 mEq/L   CO2 26 19 - 32 mEq/L   Glucose, Bld 81 70 - 99 mg/dL   BUN 16 6 - 23 mg/dL   Creatinine, Ser 9.21 0.40 - 1.50 mg/dL   GFR 875.34 >39.99 mL/min   Calcium 9.3 8.4 - 10.5 mg/dL      The ASCVD Risk score (Arnett DK, et al., 2019) failed to calculate for the following reasons:   The 2019 ASCVD risk score is only valid for ages 58 to 31    Assessment & Plan:   Problem List Items Addressed This Visit   None Visit Diagnoses       Physical exam    -  Primary   Relevant Orders   Basic metabolic panel with GFR (Completed)   Lipid panel (Completed)   CBC with Differential/Platelet (Completed)   Hepatic function panel (Completed)     Encounter for hepatitis C screening test for low risk patient       Relevant Orders   Hepatitis C antibody  Family history of type 2 diabetes  mellitus       Relevant Orders   Hemoglobin A1c (Completed)     Need for tetanus booster       Relevant Orders   Tdap vaccine greater than or equal to 7yo IM (Completed)     23 year old male here for physical exam.  Positive family history of type 2 diabetes in father.  We discussed the following health maintenance items  -Tdap given - Recommend consider flu vaccine but he declines at this time - Check labs as above.  Include hepatitis C antibody though he is low risk.  Patient requesting A1c  No follow-ups on file.    Wolm Scarlet, MD

## 2023-10-30 LAB — HEPATITIS C ANTIBODY: Hepatitis C Ab: NONREACTIVE
